# Patient Record
Sex: Female | Born: 1990 | Race: Black or African American | Hispanic: No | Marital: Single | Smoking: Current every day smoker
Health system: Southern US, Community
[De-identification: ages and names within clinical notes are randomized; demographics above are authoritative.]

## PROBLEM LIST (undated history)

## (undated) DIAGNOSIS — I10 Essential (primary) hypertension: Secondary | ICD-10-CM

---
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
## 2012-01-02 IMAGING — US US OB TRANSVAGINAL
1 series · 14 of 28 positions shown · non-contrast
Comparison: None.

CLINICAL DATA: Positive pregnancy test with vaginal bleeding.

OBSTETRIC <14 WK US AND TRANSVAGINAL OB US
TECHNIQUE: Both transabdominal and transvaginal ultrasound
examinations were performed for complete evaluation of the
gestation as well as the maternal uterus, adnexal regions, and
pelvic cul-de-sac.  Transvaginal technique was performed to assess
early pregnancy.

[Series 1: us ob comp less 14 wks · 14 of 34 slices shown]
[im 2/34]
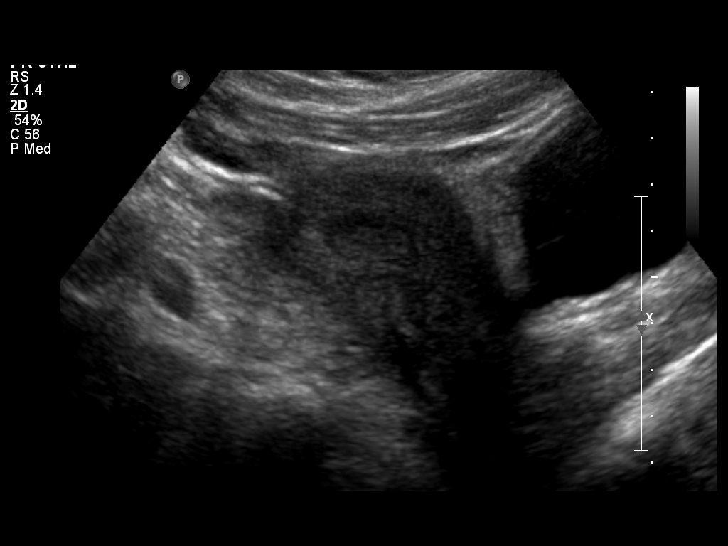
[im 4/34]
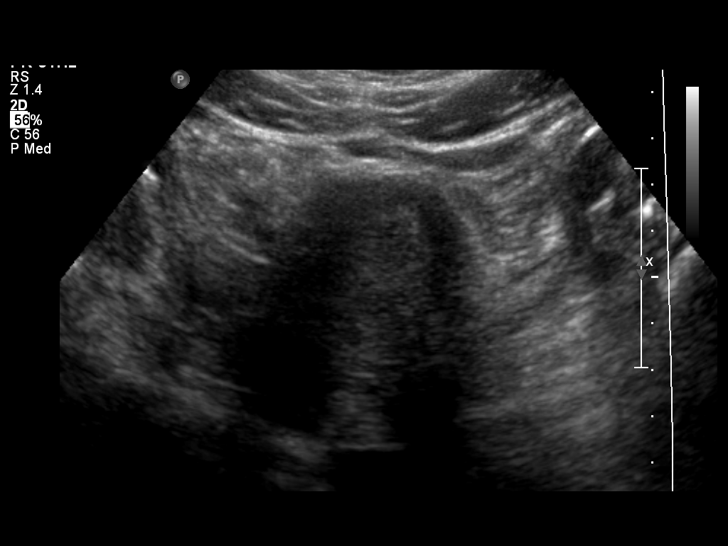
[im 7/34]
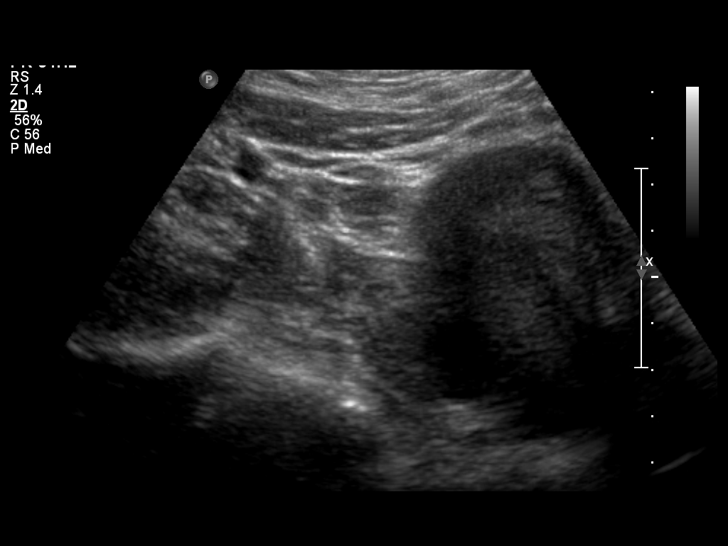
[im 9/34]
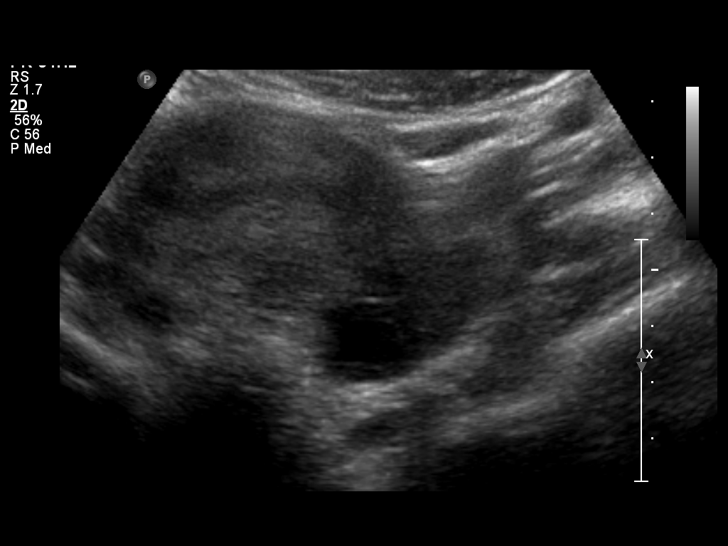
[im 12/34]
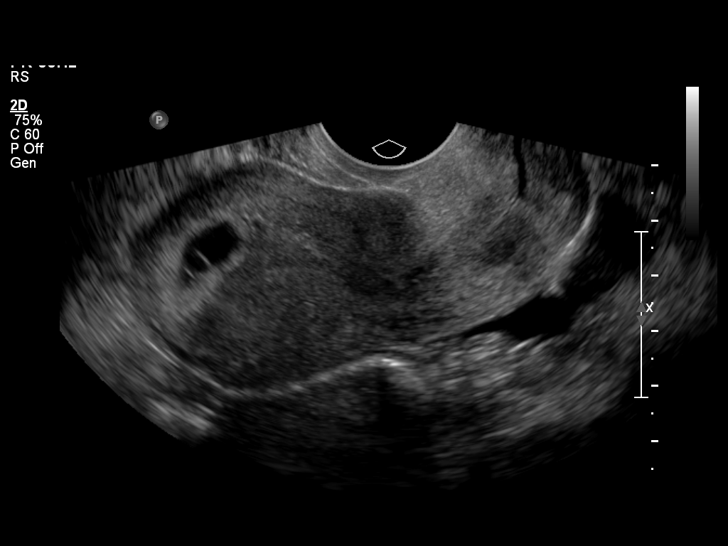
[im 14/34]
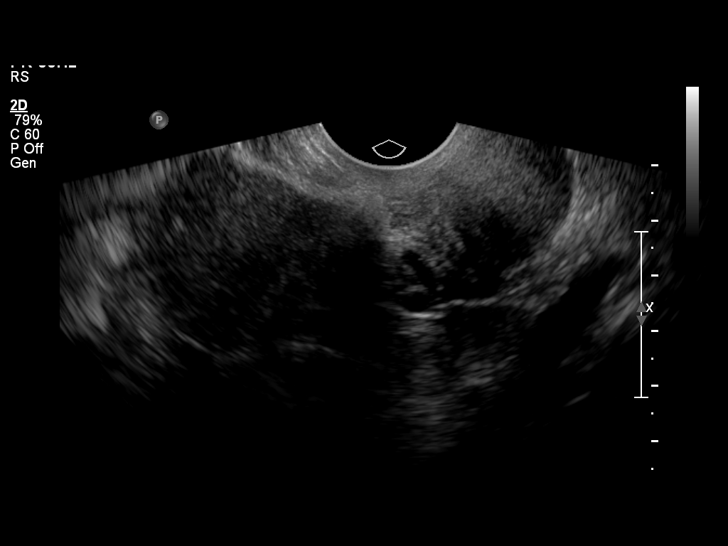
[im 16/34]
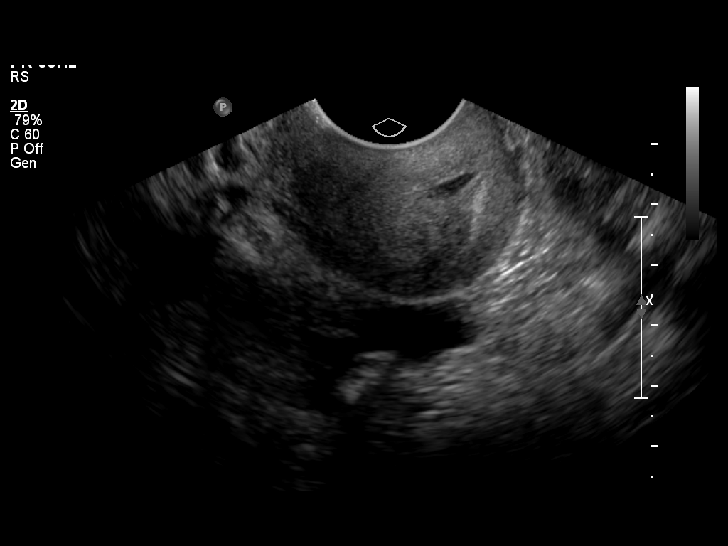
[im 19/34]
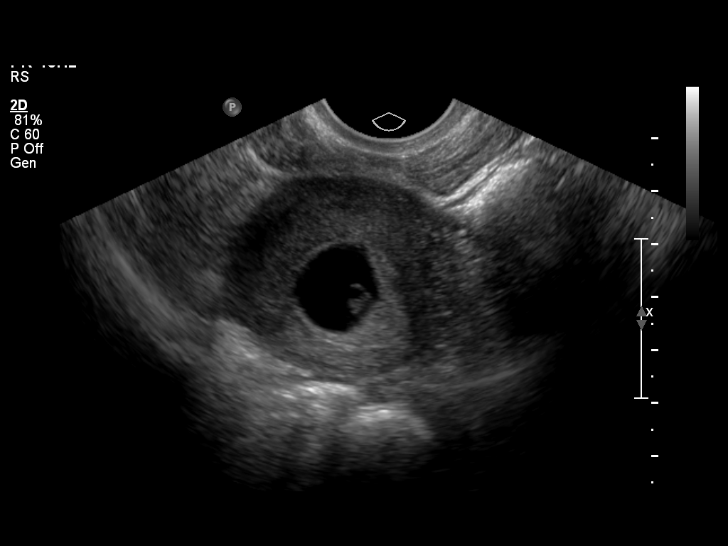
[im 21/34]
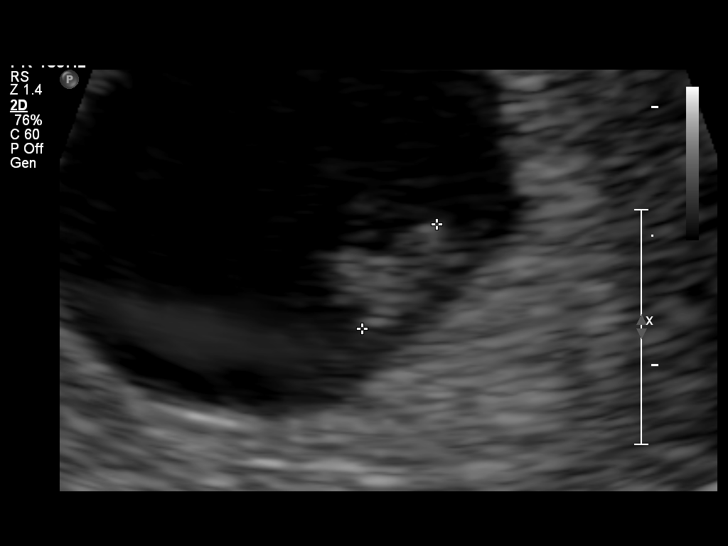
[im 24/34]
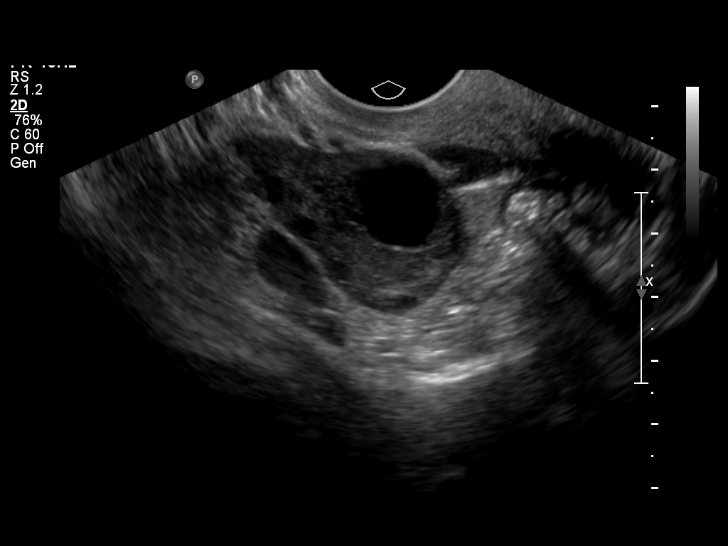
[im 26/34]
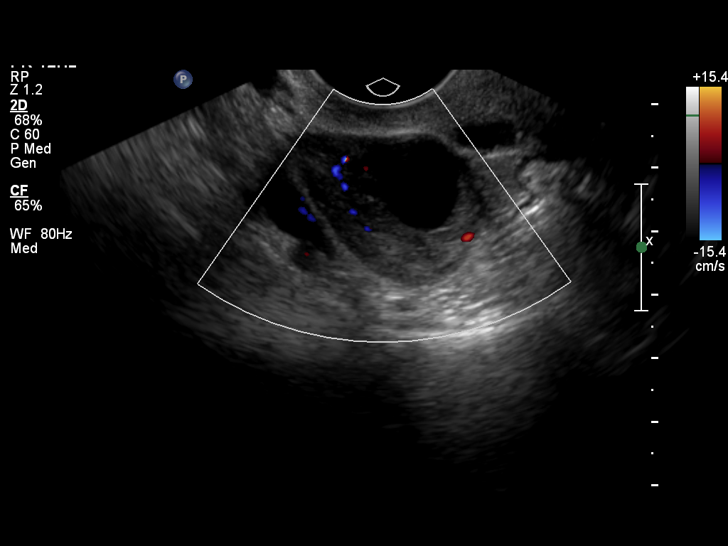
[im 29/34]
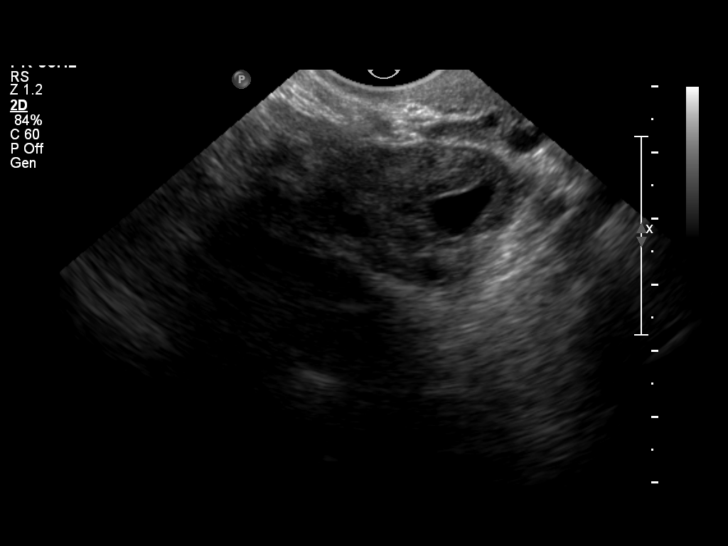
[im 31/34]
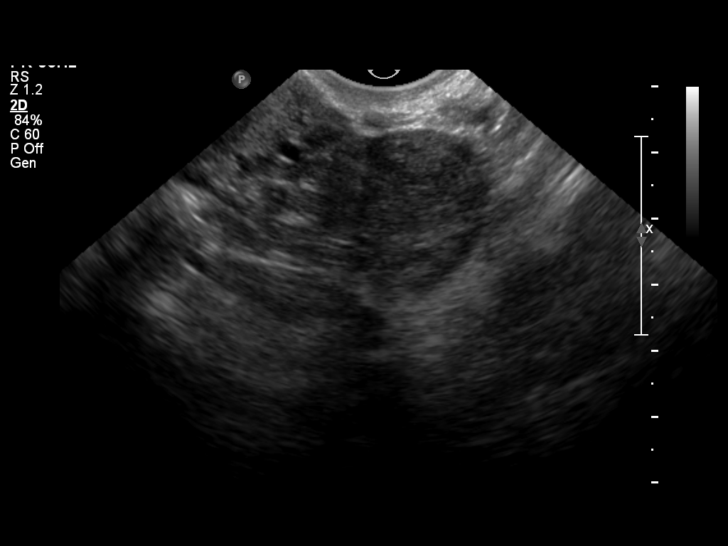
[im 34/34]
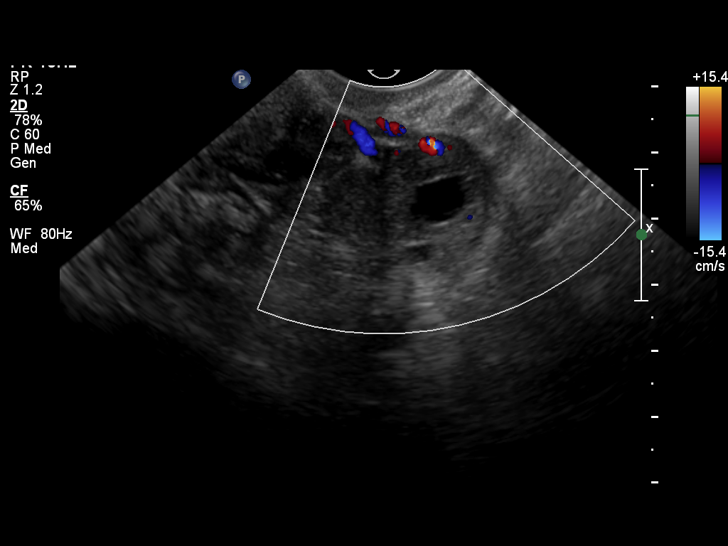

[14 of 28 positions shown; findings below may reference images not displayed]

Intrauterine gestational sac:  Single intrauterine gestational sac
is visualized with normal shape.
Yolk sac: Visualized
Embryo: Visualized
Cardiac Activity: Visualized
Heart Rate: 125 bpm

CRL: 4.9   mm  6   w  2   d         US EDC: 03/09/2011

Maternal uterus/adnexae:
No evidence for subchorionic hemorrhage.  Maternal ovaries are
unremarkable with corpus luteum cyst noted in the right ovary.
There is trace amount of free fluid in the cul-de-sac.
IMPRESSION: Single living intrauterine gestation at estimated 6-week-8-day
gestational age by crown-rump length.

## 2013-07-07 ENCOUNTER — Encounter (HOSPITAL_COMMUNITY): Payer: Self-pay | Admitting: Emergency Medicine

## 2013-07-07 ENCOUNTER — Emergency Department (HOSPITAL_COMMUNITY)
Admission: EM | Admit: 2013-07-07 | Discharge: 2013-07-07 | Disposition: A | Discharge status: Home / Self Care | Payer: Medicaid Other | Source: Home / Self Care | Attending: Family Medicine | Admitting: Family Medicine

## 2013-07-07 ENCOUNTER — Emergency Department (INDEPENDENT_AMBULATORY_CARE_PROVIDER_SITE_OTHER): Payer: Medicaid Other

## 2013-07-07 DIAGNOSIS — J45909 Unspecified asthma, uncomplicated: Secondary | ICD-10-CM

## 2013-07-07 DIAGNOSIS — J309 Allergic rhinitis, unspecified: Secondary | ICD-10-CM

## 2013-07-07 DIAGNOSIS — J302 Other seasonal allergic rhinitis: Secondary | ICD-10-CM

## 2013-07-07 HISTORY — DX: Essential (primary) hypertension: I10

## 2013-07-07 MED ORDER — TRIAMCINOLONE ACETONIDE 40 MG/ML IJ SUSP
40.0000 mg | Freq: Once | INTRAMUSCULAR | Status: AC
  Administered 2013-07-07: 40 mg via INTRAMUSCULAR

## 2013-07-07 MED ORDER — CETIRIZINE HCL 10 MG PO TABS
10.0000 mg | ORAL_TABLET | Freq: Every day | ORAL | Status: AC
Start: 2013-07-07 — End: ?

## 2013-07-07 MED ORDER — FLUTICASONE PROPIONATE 50 MCG/ACT NA SUSP: 1.0000 | Freq: Two times a day (BID) | NASAL | Status: AC

## 2013-07-07 MED ORDER — TRIAMCINOLONE ACETONIDE 40 MG/ML IJ SUSP
INTRAMUSCULAR | Status: AC
  Filled 2013-07-07: qty 1

## 2013-07-07 MED ORDER — METHYLPREDNISOLONE ACETATE 40 MG/ML IJ SUSP
80.0000 mg | Freq: Once | INTRAMUSCULAR | Status: AC
  Administered 2013-07-07: 80 mg via INTRAMUSCULAR

## 2013-07-07 MED ORDER — METHYLPREDNISOLONE ACETATE 80 MG/ML IJ SUSP
INTRAMUSCULAR | Status: AC
  Filled 2013-07-07: qty 1

## 2013-07-07 NOTE — ED Notes (Signed)
Reports spreading powdered pesticide .  That night noticed difficulty breathing, sob, cough and wheezing

## 2013-07-07 NOTE — ED Provider Notes (Signed)
CSN: [id number]     Arrival date & time 07/07/13  1536 History   First MD Initiated Contact with Patient 07/07/13 1557     Chief Complaint  Patient presents with  . Shortness of Breath   (Consider location/radiation/quality/duration/timing/severity/associated sxs/prior Treatment) Patient is a 23 y.o. female presenting with shortness of breath. The history is provided by the patient.  Shortness of Breath Severity:  Mild Onset quality:  Gradual Duration:  3 days Progression:  Unchanged Chronicity:  New Context comment:  Thinks it is related to spraying pesticide on plants at home in yard. Relieved by:  None tried Worsened by:  Nothing tried Associated symptoms: cough   Associated symptoms: no fever and no wheezing   Risk factors comment:  Smoker   Past Medical History  Diagnosis Date  . Hypertension    History reviewed. No pertinent past surgical history. No family history on file. History  Substance Use Topics  . Smoking status: Current Every Day Smoker  . Smokeless tobacco: Not on file  . Alcohol Use: No   OB History   Grav Para Term Preterm Abortions TAB SAB Ect Mult Living                 Review of Systems  Constitutional: Negative.  Negative for fever and chills.  HENT: Positive for congestion and rhinorrhea.   Respiratory: Positive for cough and shortness of breath. Negative for wheezing.   Gastrointestinal: Negative.   Skin: Negative.     Allergies  Benadryl  Home Medications   Current Outpatient Rx  Name  Route  Sig  Dispense  Refill  . diphenhydrAMINE (BENADRYL) 25 MG tablet   Oral   Take 25 mg by mouth every 6 (six) hours as needed.         Marland Kitchen. guaiFENesin (MUCINEX) 600 MG 12 hr tablet   Oral   Take by mouth 2 (two) times daily.         . cetirizine (ZYRTEC) 10 MG tablet   Oral   Take 1 tablet (10 mg total) by mouth daily.   30 tablet   1   . fluticasone (FLONASE) 50 MCG/ACT nasal spray   Each Nare   Place 1 spray into both nostrils 2  (two) times daily.   1 g   2    BP 110/81  Pulse 98  Temp(Src) 98.5 F (36.9 C) (Oral)  Resp 16  SpO2 99%  LMP 06/09/2013 Physical Exam  Nursing note and vitals reviewed. Constitutional: She is oriented to person, place, and time. She appears well-developed and well-nourished. No distress.  HENT:  Head: Normocephalic.  Right Ear: External ear normal.  Left Ear: External ear normal.  Mouth/Throat: Oropharynx is clear and moist.  Eyes: Conjunctivae are normal. Pupils are equal, round, and reactive to light.  Neck: Normal range of motion. Neck supple.  Cardiovascular: Regular rhythm, normal heart sounds and intact distal pulses.   Pulmonary/Chest: Effort normal and breath sounds normal.  Lymphadenopathy:    She has no cervical adenopathy.  Neurological: She is alert and oriented to person, place, and time.  Skin: Skin is warm and dry.    ED Course  Procedures (including critical care time) Labs Review Labs Reviewed - No data to display Imaging Review Dg Chest 2 View  07/07/2013   CLINICAL DATA:  Shortness of breath and cough  EXAM: CHEST  2 VIEW  COMPARISON:  January 30, 2008  FINDINGS: Lungs are clear. Heart size and pulmonary vascularity are normal.  No adenopathy. No bone lesions.  IMPRESSION: No abnormality noted.   Electronically Signed   By: Bretta Bang M.D.   On: 07/07/2013 16:46   X-rays reviewed and report per radiologist.   MDM   1. Allergic bronchitis   2. Seasonal allergic rhinitis        Linna Hoff, MD 07/07/13 [phone]

## 2013-07-07 NOTE — Discharge Instructions (Signed)
Use medicine as prescribed, drink lots of fluids, no more smoking, see your doctor if further problems °

## 2014-12-24 IMAGING — CR DG CHEST 2V
2 series · 2 of 2 positions shown · non-contrast
Comparison: January 30, 2008

CLINICAL DATA: Shortness of breath and cough

EXAM:
CHEST  2 VIEW

[view not recorded (1 of 2)]
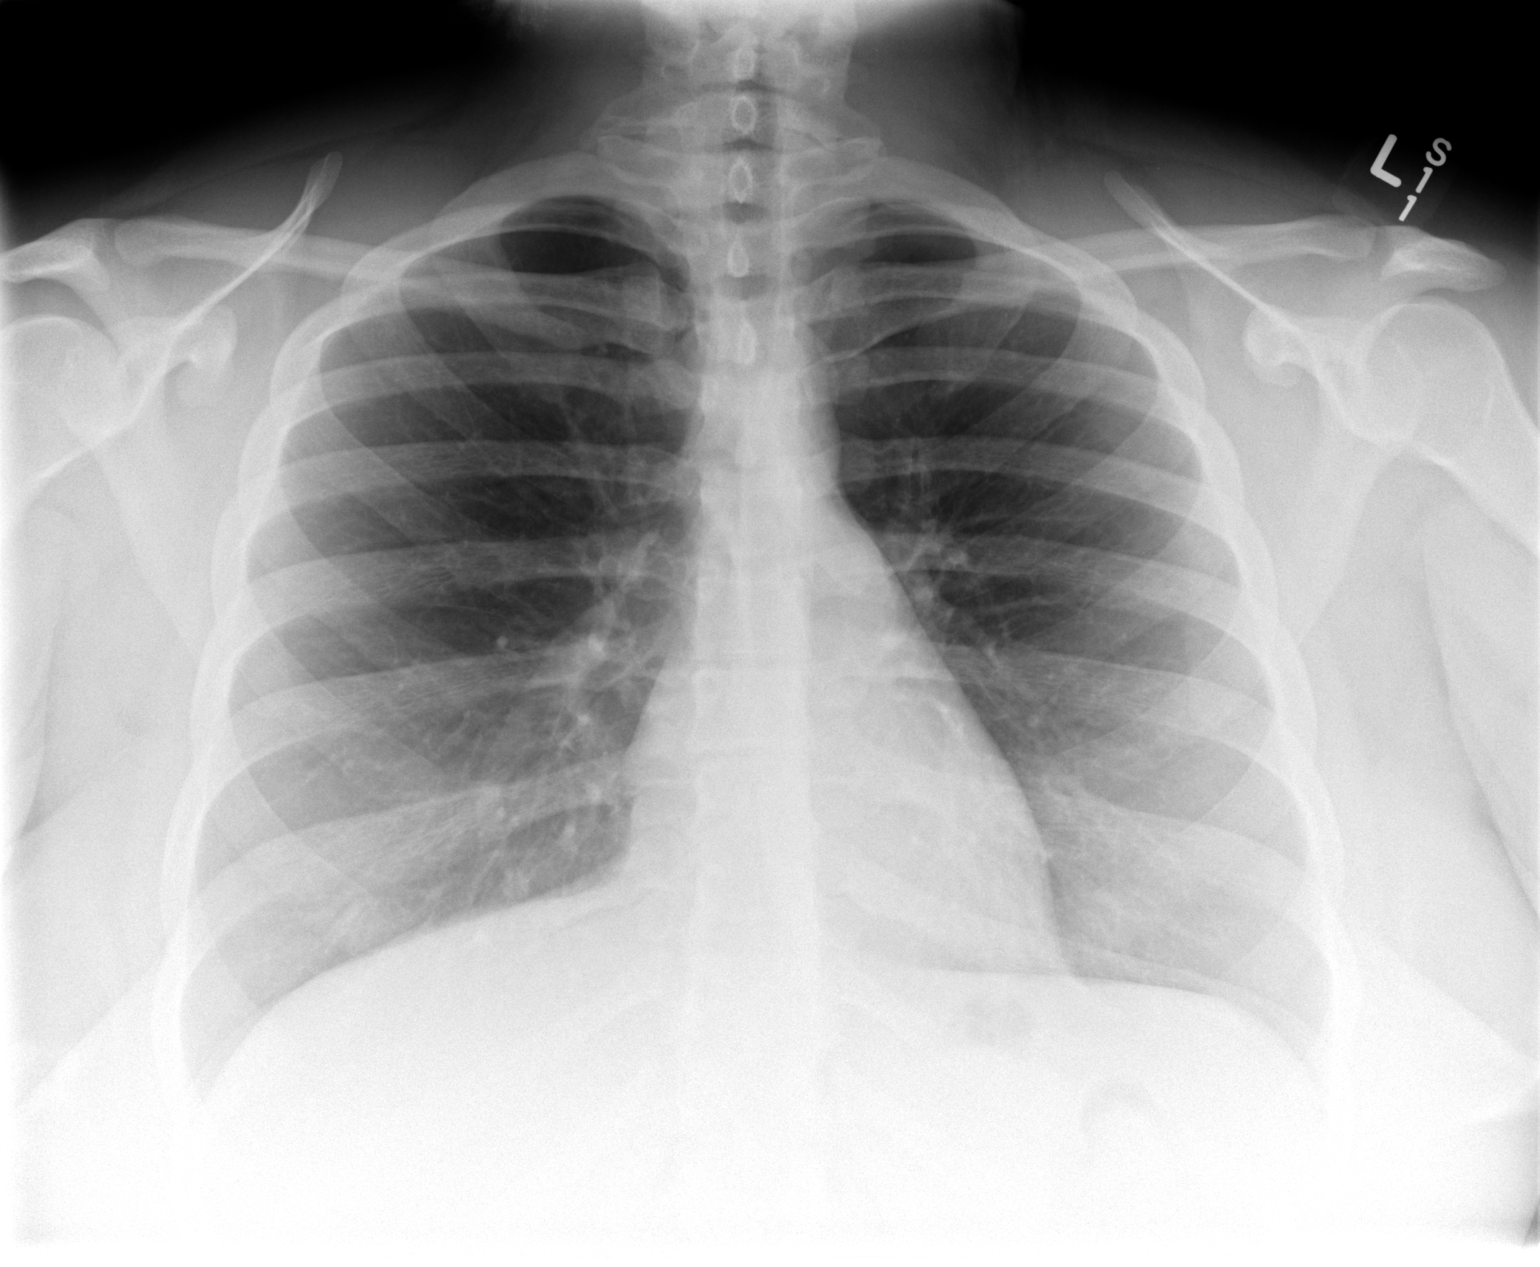

[view not recorded (2 of 2)]
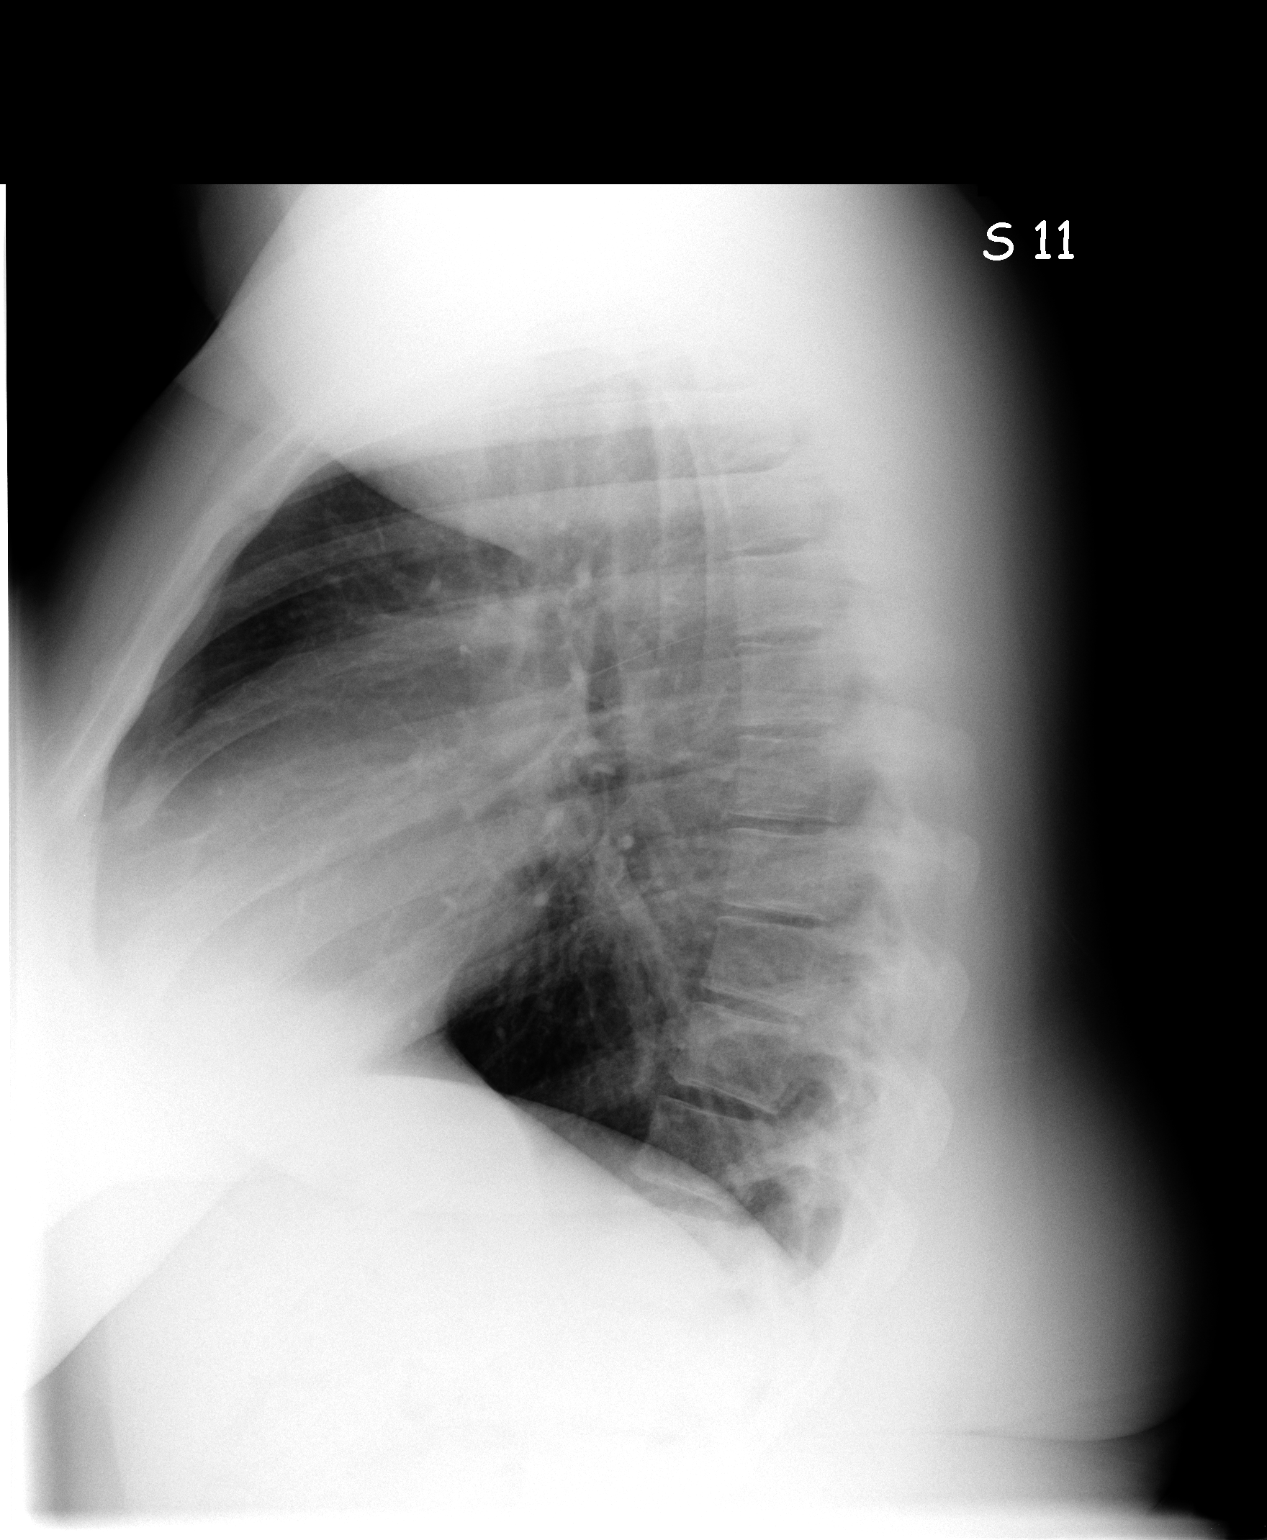

[2 of 2 positions shown; findings below may reference images not displayed]

FINDINGS: Lungs are clear. Heart size and pulmonary vascularity are normal. No
adenopathy. No bone lesions.
IMPRESSION: No abnormality noted.
